# Patient Record
Sex: Female | Born: 1980 | Race: Black or African American | Hispanic: No
Health system: Southern US, Community
[De-identification: ages and names within clinical notes are randomized; demographics above are authoritative.]

---

## 2017-12-20 ENCOUNTER — Other Ambulatory Visit (HOSPITAL_COMMUNITY)
Admission: RE | Admit: 2017-12-20 | Discharge: 2017-12-20 | Disposition: A | Discharge status: Home / Self Care | Payer: Self-pay | Source: Ambulatory Visit | Attending: Family Medicine | Admitting: Family Medicine

## 2017-12-20 ENCOUNTER — Other Ambulatory Visit: Payer: Self-pay | Admitting: Family Medicine

## 2017-12-20 DIAGNOSIS — Z0000 Encounter for general adult medical examination without abnormal findings: Secondary | ICD-10-CM | POA: Insufficient documentation

## 2017-12-23 LAB — CYTOLOGY - PAP
Diagnosis: NEGATIVE
HPV: NOT DETECTED

## 2018-12-19 ENCOUNTER — Other Ambulatory Visit: Payer: Self-pay | Admitting: Family Medicine

## 2018-12-19 DIAGNOSIS — Z1231 Encounter for screening mammogram for malignant neoplasm of breast: Secondary | ICD-10-CM

## 2018-12-20 ENCOUNTER — Other Ambulatory Visit: Payer: Self-pay | Admitting: Family Medicine

## 2019-02-08 ENCOUNTER — Ambulatory Visit: Payer: Self-pay

## 2019-03-01 ENCOUNTER — Ambulatory Visit: Payer: BC Managed Care – PPO | Attending: Family

## 2019-03-01 DIAGNOSIS — Z23 Encounter for immunization: Secondary | ICD-10-CM | POA: Insufficient documentation

## 2019-03-01 NOTE — Progress Notes (Signed)
   Covid-19 Vaccination Clinic  Name:  Miranda Crawford    MRN: [id number] DOB: 1980/12/29  03/01/2019  Miranda Crawford was observed post Covid-19 immunization for 15 minutes without incidence. She was provided with Vaccine Information Sheet and instruction to access the V-Safe system.   Miranda Crawford was instructed to call 911 with any severe reactions post vaccine: Marland Kitchen Difficulty breathing  . Swelling of your face and throat  . A fast heartbeat  . A bad rash all over your body  . Dizziness and weakness    Immunizations Administered    Name Date Dose VIS Date Route   Moderna COVID-19 Vaccine 03/01/2019  1:53 PM 0.5 mL 12/19/2018 Intramuscular   Manufacturer: Moderna   Lot: [id number]   NDC: 91028-902-28

## 2019-03-22 ENCOUNTER — Ambulatory Visit
Admission: RE | Admit: 2019-03-22 | Discharge: 2019-03-22 | Disposition: A | Discharge status: Home / Self Care | Payer: BC Managed Care – PPO | Source: Ambulatory Visit | Attending: Family Medicine | Admitting: Family Medicine

## 2019-03-22 ENCOUNTER — Other Ambulatory Visit: Payer: Self-pay

## 2019-03-22 DIAGNOSIS — Z1231 Encounter for screening mammogram for malignant neoplasm of breast: Secondary | ICD-10-CM

## 2019-04-03 ENCOUNTER — Ambulatory Visit: Payer: BC Managed Care – PPO | Attending: Family

## 2019-04-03 DIAGNOSIS — Z23 Encounter for immunization: Secondary | ICD-10-CM

## 2019-04-03 NOTE — Progress Notes (Signed)
   Covid-19 Vaccination Clinic  Name:  Miranda Crawford    MRN: [id number] DOB: [birth year]  04/03/2019  Miranda Crawford was observed post Covid-19 immunization for 15 minutes without incident. She was provided with Vaccine Information Sheet and instruction to access the V-Safe system.   Miranda Crawford was instructed to call 911 with any severe reactions post vaccine: Marland Kitchen Difficulty breathing  . Swelling of face and throat  . A fast heartbeat  . A bad rash all over body  . Dizziness and weakness   Immunizations Administered    Name Date Dose VIS Date Route   Moderna COVID-19 Vaccine 04/03/2019 12:19 PM 0.5 mL 12/19/2018 Intramuscular   Manufacturer: Moderna   Lot: [id number]   NDC: 77116-579-03

## 2020-02-12 ENCOUNTER — Ambulatory Visit: Payer: Self-pay | Attending: Internal Medicine

## 2020-02-12 DIAGNOSIS — Z23 Encounter for immunization: Secondary | ICD-10-CM

## 2020-02-12 NOTE — Progress Notes (Signed)
   Covid-19 Vaccination Clinic  Name:  Miranda Crawford    MRN: [id number] DOB: December 19, 1980  02/12/2020  [name] was observed post Covid-19 immunization for 15 minutes without incident. She was provided with Vaccine Information Sheet and instruction to access the V-Safe system.   [name] was instructed to call 911 with any severe reactions post vaccine: Marland Kitchen Difficulty breathing  . Swelling of face and throat  . A fast heartbeat  . A bad rash all over body  . Dizziness and weakness   Immunizations Administered    Name Date Dose VIS Date Route   Moderna Covid-19 Booster Vaccine 02/12/2020  1:44 PM 0.25 mL 11/07/2019 Intramuscular   Manufacturer: Moderna   Lot: [id number]   NDC: 88502-774-12

## 2020-02-25 ENCOUNTER — Other Ambulatory Visit: Payer: Self-pay | Admitting: Family Medicine

## 2020-02-25 DIAGNOSIS — Z1231 Encounter for screening mammogram for malignant neoplasm of breast: Secondary | ICD-10-CM

## 2020-04-10 ENCOUNTER — Other Ambulatory Visit: Payer: Self-pay

## 2020-04-10 ENCOUNTER — Ambulatory Visit
Admission: RE | Admit: 2020-04-10 | Discharge: 2020-04-10 | Disposition: A | Discharge status: Home / Self Care | Payer: BC Managed Care – PPO | Source: Ambulatory Visit | Attending: Family Medicine | Admitting: Family Medicine

## 2020-04-10 DIAGNOSIS — Z1231 Encounter for screening mammogram for malignant neoplasm of breast: Secondary | ICD-10-CM

## 2020-04-11 ENCOUNTER — Inpatient Hospital Stay: Admission: RE | Admit: 2020-04-11 | Payer: Self-pay | Source: Ambulatory Visit

## 2021-06-28 IMAGING — MG MM DIGITAL SCREENING BILAT W/ TOMO AND CAD
8 series · 9 of 24 positions shown · non-contrast
Comparison: Previous exam(s).

CLINICAL DATA: Screening.

EXAM:
DIGITAL SCREENING BILATERAL MAMMOGRAM WITH TOMOSYNTHESIS AND CAD
TECHNIQUE: Bilateral screening digital craniocaudal and mediolateral oblique
mammograms were obtained. Bilateral screening digital breast
tomosynthesis was performed. The images were evaluated with
computer-aided detection.

[R MLO synth-2D]
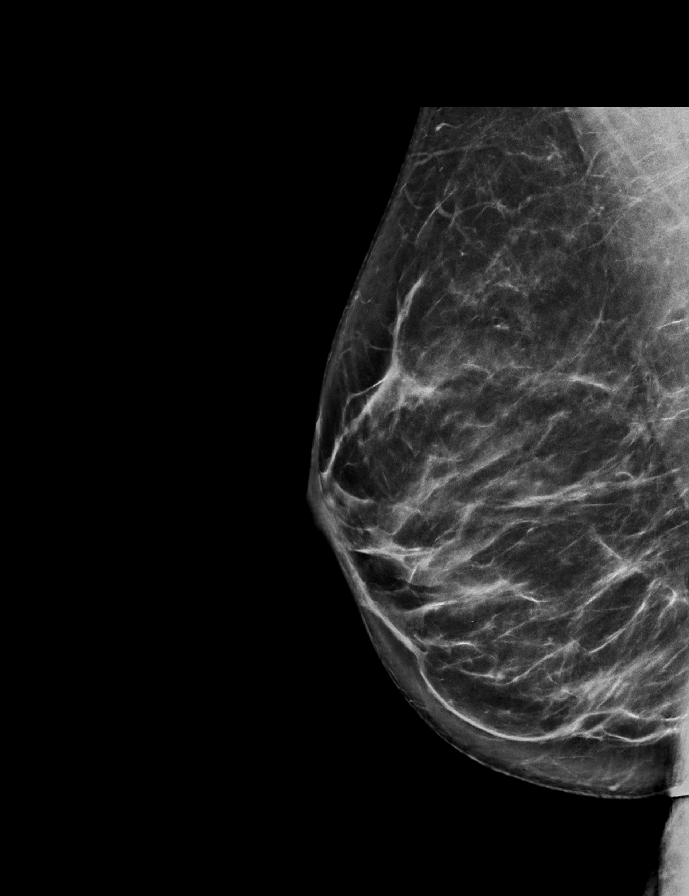

[L MLO synth-2D]
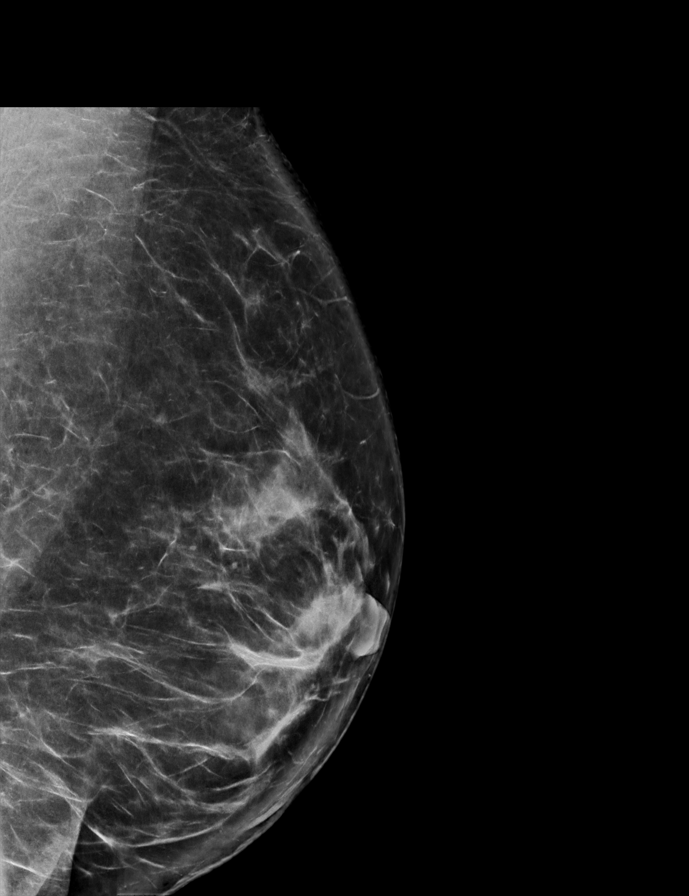

[R CC synth-2D]
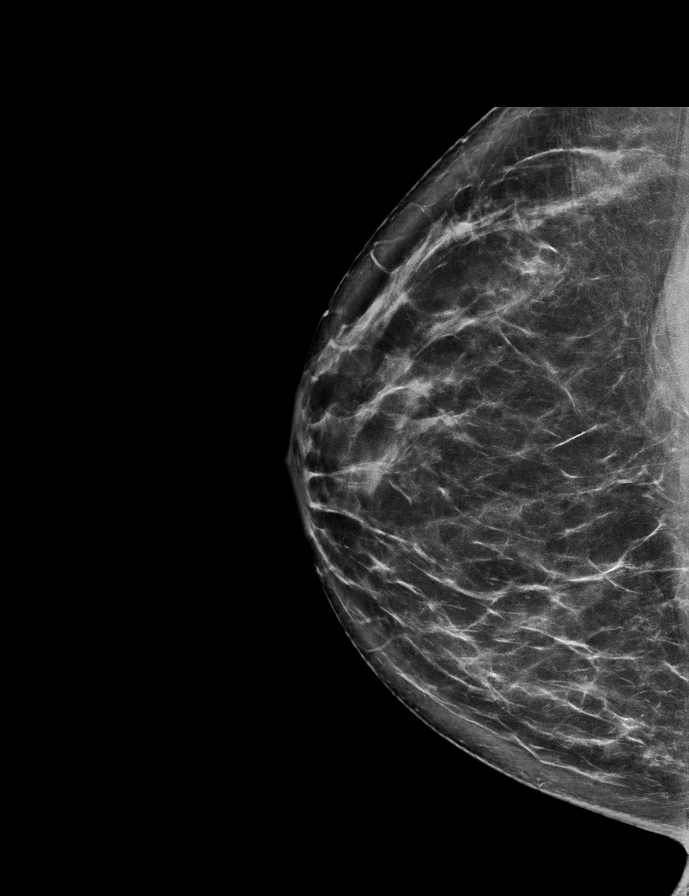

[L CC synth-2D]
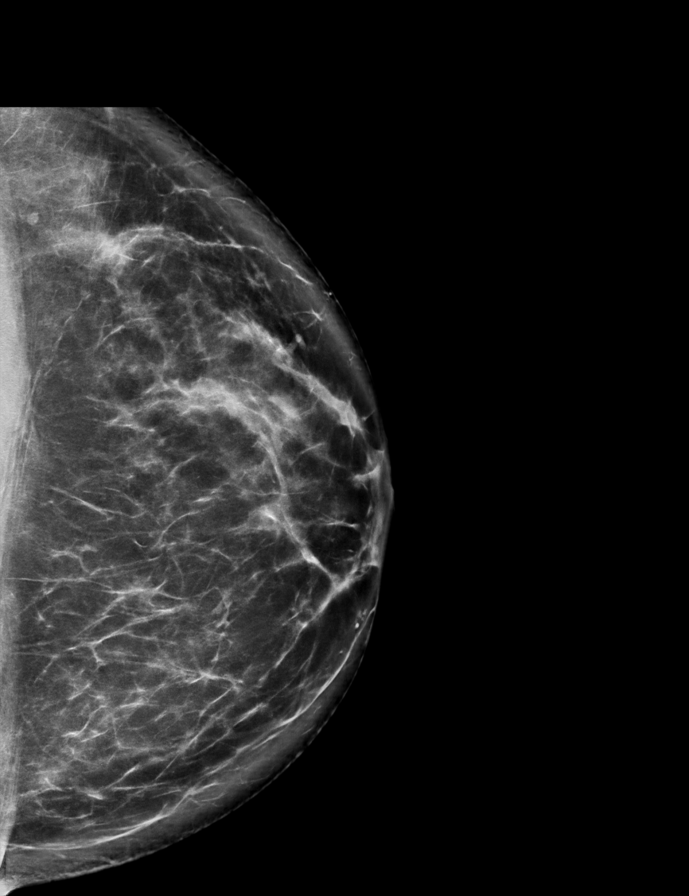

[L CC tomo · 2 of 92 frames shown]
[frame 30/92]
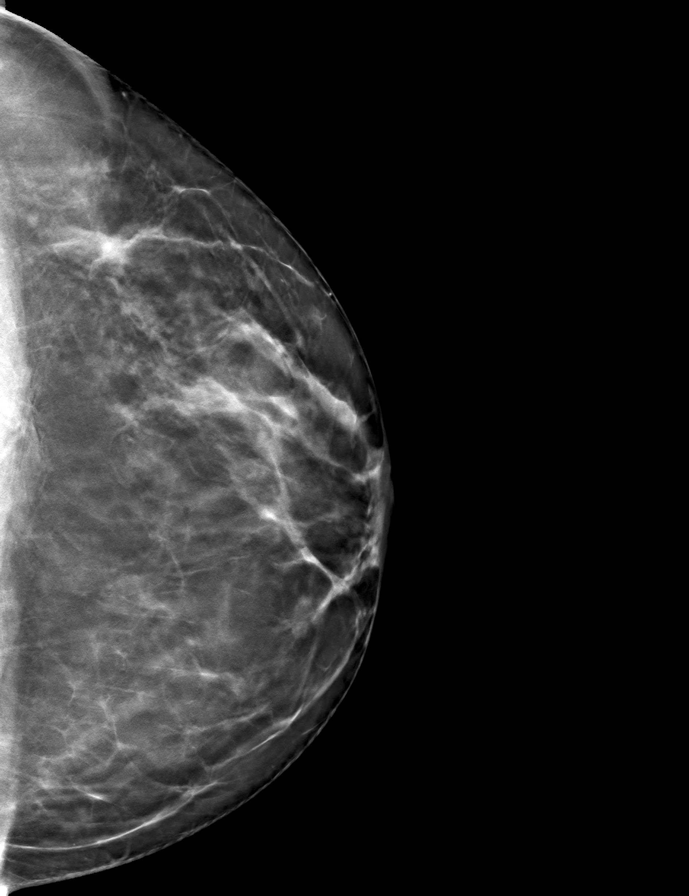
[frame 47/92]
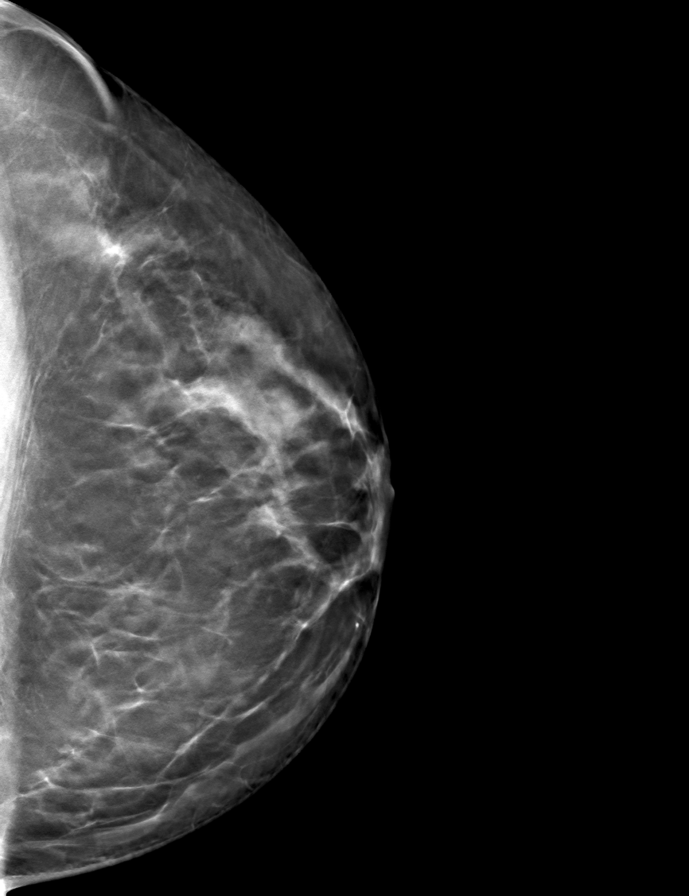

[R MLO tomo · tomo slice 41/80.0]
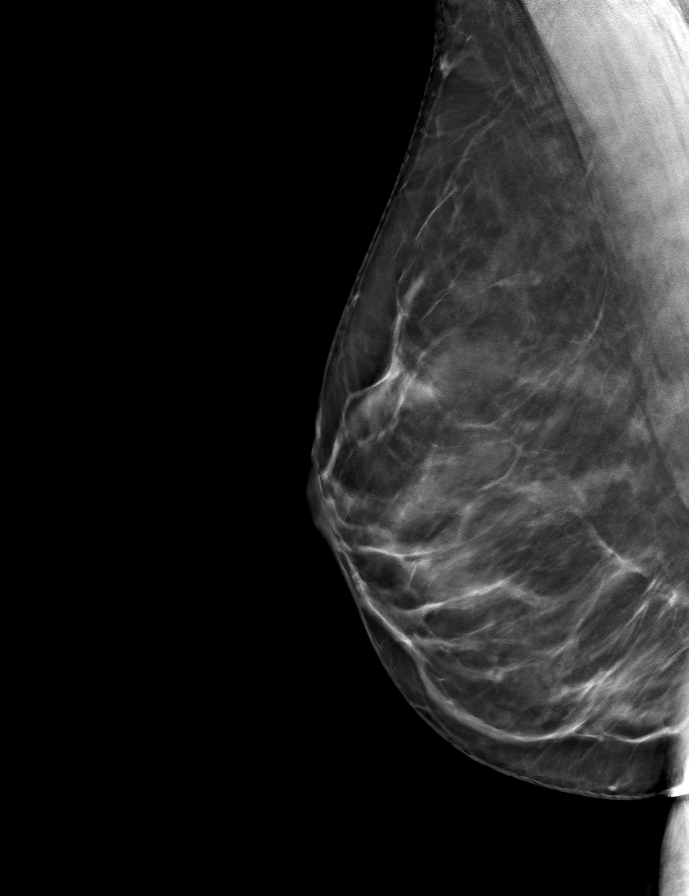

[R CC tomo · tomo slice 43/84.0]
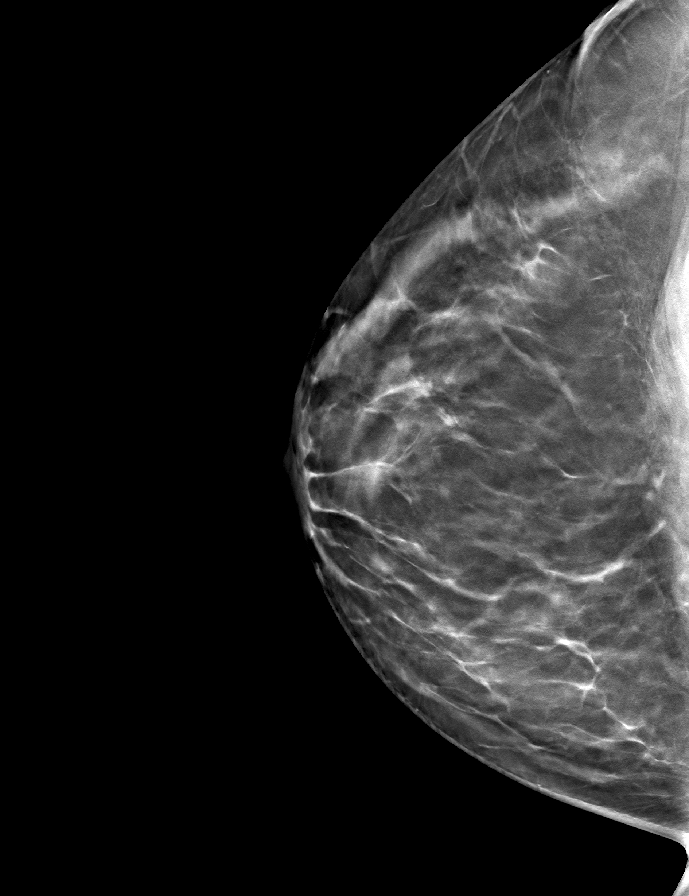

[L MLO tomo · tomo slice 47/93.0]
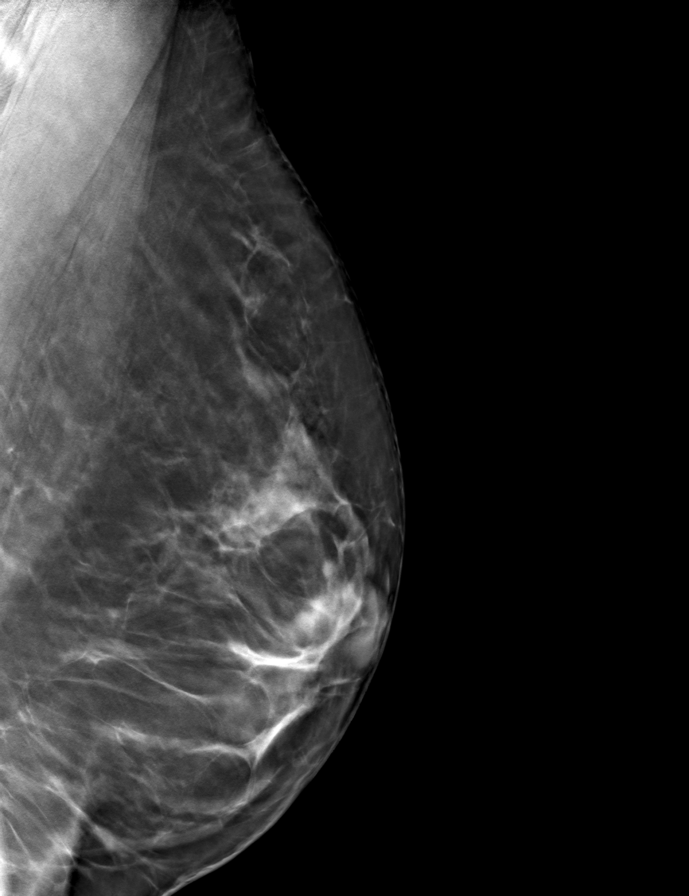

[9 of 24 positions shown; findings below may reference images not displayed]

ACR Breast Density Category c: The breast tissue is heterogeneously
dense, which may obscure small masses.
FINDINGS: There are no findings suspicious for malignancy. The images were
evaluated with computer-aided detection.
IMPRESSION: No mammographic evidence of malignancy. A result letter of this
screening mammogram will be mailed directly to the patient.

RECOMMENDATION:
Screening mammogram in one year. (Code:T4-5-GWO)

BI-RADS CATEGORY  1: Negative.
